# Patient Record
Sex: Female | Born: 1980 | Race: Black or African American | Hispanic: No | Marital: Single | Smoking: Never smoker
Health system: Southern US, Community
[De-identification: ages and names within clinical notes are randomized; demographics above are authoritative.]

## PROBLEM LIST (undated history)

## (undated) DIAGNOSIS — R030 Elevated blood-pressure reading, without diagnosis of hypertension: Secondary | ICD-10-CM

## (undated) DIAGNOSIS — I10 Essential (primary) hypertension: Secondary | ICD-10-CM

## (undated) DIAGNOSIS — B379 Candidiasis, unspecified: Secondary | ICD-10-CM

## (undated) HISTORY — DX: Elevated blood-pressure reading, without diagnosis of hypertension: R03.0

## (undated) HISTORY — PX: WISDOM TOOTH EXTRACTION: SHX21

## (undated) HISTORY — DX: Candidiasis, unspecified: B37.9

## (undated) HISTORY — PX: HIP SURGERY: SHX245

## (undated) HISTORY — PX: TUBAL LIGATION: SHX77

## (undated) HISTORY — PX: OTHER SURGICAL HISTORY: SHX169

## (undated) HISTORY — DX: Essential (primary) hypertension: I10

---

## 2001-12-16 ENCOUNTER — Other Ambulatory Visit: Admission: RE | Admit: 2001-12-16 | Discharge: 2001-12-16 | Payer: Self-pay | Admitting: *Deleted

## 2002-06-13 ENCOUNTER — Emergency Department (HOSPITAL_COMMUNITY): Admission: EM | Admit: 2002-06-13 | Discharge: 2002-06-13 | Payer: Self-pay | Admitting: Emergency Medicine

## 2004-05-06 ENCOUNTER — Other Ambulatory Visit: Admission: RE | Admit: 2004-05-06 | Discharge: 2004-05-06 | Payer: Self-pay | Admitting: Family Medicine

## 2005-07-22 ENCOUNTER — Other Ambulatory Visit: Admission: RE | Admit: 2005-07-22 | Discharge: 2005-07-22 | Payer: Self-pay | Admitting: Obstetrics and Gynecology

## 2005-12-14 ENCOUNTER — Inpatient Hospital Stay (HOSPITAL_COMMUNITY): Admission: AD | Admit: 2005-12-14 | Discharge: 2005-12-14 | Payer: Self-pay | Admitting: Obstetrics and Gynecology

## 2005-12-25 ENCOUNTER — Inpatient Hospital Stay (HOSPITAL_COMMUNITY): Admission: AD | Admit: 2005-12-25 | Discharge: 2005-12-25 | Payer: Self-pay | Admitting: Obstetrics and Gynecology

## 2005-12-26 ENCOUNTER — Inpatient Hospital Stay (HOSPITAL_COMMUNITY): Admission: AD | Admit: 2005-12-26 | Discharge: 2005-12-29 | Payer: Self-pay | Admitting: Obstetrics and Gynecology

## 2005-12-27 ENCOUNTER — Encounter (INDEPENDENT_AMBULATORY_CARE_PROVIDER_SITE_OTHER): Payer: Self-pay | Admitting: Specialist

## 2006-04-24 ENCOUNTER — Emergency Department (HOSPITAL_COMMUNITY): Admission: EM | Admit: 2006-04-24 | Discharge: 2006-04-25 | Payer: Self-pay | Admitting: Emergency Medicine

## 2006-06-23 ENCOUNTER — Encounter: Admission: RE | Admit: 2006-06-23 | Discharge: 2006-06-23 | Payer: Self-pay | Admitting: Surgery

## 2006-06-24 ENCOUNTER — Ambulatory Visit (HOSPITAL_BASED_OUTPATIENT_CLINIC_OR_DEPARTMENT_OTHER): Admission: RE | Admit: 2006-06-24 | Discharge: 2006-06-24 | Payer: Self-pay | Admitting: Surgery

## 2006-06-24 ENCOUNTER — Encounter (INDEPENDENT_AMBULATORY_CARE_PROVIDER_SITE_OTHER): Payer: Self-pay | Admitting: Surgery

## 2006-07-03 ENCOUNTER — Emergency Department (HOSPITAL_COMMUNITY): Admission: EM | Admit: 2006-07-03 | Discharge: 2006-07-03 | Payer: Self-pay | Admitting: Emergency Medicine

## 2008-09-05 ENCOUNTER — Emergency Department (HOSPITAL_BASED_OUTPATIENT_CLINIC_OR_DEPARTMENT_OTHER): Admission: EM | Admit: 2008-09-05 | Discharge: 2008-09-05 | Payer: Self-pay | Admitting: Emergency Medicine

## 2008-09-13 ENCOUNTER — Encounter: Admission: RE | Admit: 2008-09-13 | Discharge: 2008-10-01 | Payer: Self-pay | Admitting: Sports Medicine

## 2009-06-24 ENCOUNTER — Ambulatory Visit (HOSPITAL_COMMUNITY): Admission: RE | Admit: 2009-06-24 | Discharge: 2009-06-24 | Payer: Self-pay | Admitting: Family Medicine

## 2010-05-20 NOTE — Op Note (Signed)
[name], [name] NO.:  [id number]   MEDICAL RECORD NO.:  [id number]          PATIENT TYPE:  AMB   LOCATION:  DSC                          FACILITY:  [facility]   PHYSICIAN:  Thomas A. Cornett, M.D.DATE OF BIRTH:  [birth year]   DATE OF PROCEDURE:  06/24/2006  DATE OF DISCHARGE:                               OPERATIVE REPORT   PREOPERATIVE DIAGNOSIS:  Bilateral axillary hidradenitis suppurativa  measuring approximately 5 x 8 cm in maximal diameter.   POSTOPERATIVE DIAGNOSIS:  Bilateral axillary hidradenitis suppurativa  measuring approximately 5 x 8 cm in maximal diameter.   PROCEDURE:  Excision of bilateral axillary hidradenitis measuring 5 x 8  cm with intermediate two-layer closure of axillae.   SURGEON:  Maisie Fus A. Cornett, M.D.   ANESTHESIA:  LMA with 20 mL of 0.25% Sensorcaine.   SPECIMEN:  Axillary skin with hidradenitis to pathology.   INDICATIONS FOR PROCEDURE:  The patient is a 30 year old female with  recurrent episodes of hidradenitis suppurativa in both axillae.  She  presents today for wide excision of the diseased tissue.   DESCRIPTION OF PROCEDURE:  The patient was brought to the operating room  and placed supine.  After induction of general anesthesia, both axillae  were prepped and draped in a sterile fashion.  Both breasts were taped  together to create traction.  The right axilla was addressed first.  The  area measured 5 x 9 cm.  I infiltrated this with local anesthesia and  excised it with a scalpel down to healthy fat.  A second area just  medial to that was excised measuring 2 x 2 cm.  I then closed both  wounds after creating skin flaps using the cautery with two layers, with  a deep layer of 3-0 Vicryl and 4-0 Monocryl in a subcuticular fashion.  The left axilla was then approached in a similar fashion.  The area  measured 5 x 9 cm.  It was infiltrated as well with local anesthesia.  We excised the area.  A second small area was on  her left arm, we  excised it as well, measuring 1 cm in maximal diameter.  I then again  raised skin flaps and then closed the wound in layers using a deep layer  of 3-0 Vicryl and a 4-0 Monocryl layer to close the skin.  Dermabond was  applied to both incisions.  All final counts of sponge, needle and  instruments were found to be correct for this portion of the case.  The  patient was awoken and taken to recovery in satisfactory condition.      Thomas A. Cornett, M.D.  Electronically Signed     TAC/MEDQ  D:  06/24/2006  T:  06/24/2006  Job:  [id number]

## 2010-05-23 NOTE — Discharge Summary (Signed)
Janet Williams, Janet Williams NO.:  [id number]   MEDICAL RECORD NO.:  [id number]          PATIENT TYPE:  INP   LOCATION:  9124                          [facility]ACILITY:  [facility]   PHYSICIAN:  Charles A. Delcambre, MDDATE OF BIRTH:  1980-10-31   DATE OF ADMISSION:  12/26/2005  DATE OF DISCHARGE:  12/29/2005                               DISCHARGE SUMMARY   PRIMARY DISCHARGE DIAGNOSES:  1. Intrauterine pregnancy, 40 weeks and 1 day.  2. Failure to progress with cephalopelvic disproportion, arrest of      dilatation at 5 centimeters.   PROCEDURE:  Primary low-transverse cesarean section.   DISPOSITION:  The patient discharged home to follow up in the office in  48 hours to discontinue staples. She was given convalescent instructions  to notify if temperature greater than 100 degrees, increase in pain or  bleeding, incisional drainage or erythema. No driving for 2 weeks, no  bath for 2 weeks, to shower okay for this time period. Precaution for  PIH, scotomata, right upper quadrant pain, blurred vision, or headache.   PRESCRIPTIONS GIVEN:  1. Percocet, 5/325, one to two p.o. q.4 h. p.r.n. #40.  2. Hemacyte 1 p.o. every day #30.  3. Motrin 600 mg 1 p.o. q.6 h. p.r.n. #30 one refill.   LABORATORY:  Postoperative hematocrit 30.7, hemoglobin 10.7, PIH panel  was within normal limits day of discharge.  Placenta to L&D.   OPERATIVE FINDINGS:  Vigorous female, Apgars 8 and 8, 9 pounds 7 ounces,  54 cm in length.   DESCRIPTION OF PROCEDURE:  The patient was admitted and, secondary to  decreased fetal movement and moderately favorable cervix, she was  induced first with Cervidil and then with Pitocin. She progressed on to  5 cm but remained there with failure to progress beyond that point. She  was taken for cesarean section with findings as noted above.  Postoperatively, she remained afebrile and did well on postop day #1.  She continued to do well and general diet was given to her  secondary to  positive return of flatus on postop day #1. She tolerated a general  diet. She was noted on postop day #1, evening, to have a blood pressure  of up to 142/101, asymptomatic. During that day, she had had several  pressures in 140s/90s. The morning of postop day 2, was 149/92, repeats  times 3 were 130s/80s. As she remained asymptomatic and with a normal  PIH panel, she was discharged home on early discharge with her adamant  request and with pediatrician discharging the baby. She was strictly  precautioned that she should notify of  any scotomata, right upper quadrant pain, or headache, and we will see  her back in the office in approximately 48 hours to discontinue staples.  The pain was controlled through day 2 and general diet was tolerated.  She voided without difficulty and was ambulating and, for this reason,  was discharged home with followup as noted above.      Charles A. Sydnee Cabal, MD  Electronically Signed     CAD/MEDQ  D:  12/29/2005  T:  12/29/2005  Job:  [id number]

## 2010-05-23 NOTE — Op Note (Signed)
NAMELILYTH, LAWYER NO.:  [id number]   MEDICAL RECORD NO.:  [id number]          PATIENT TYPE:  INP   LOCATION:  9124                          [facility]ACILITY[facility]  [facility]   PHYSICIAN:  Charles A. Delcambre, MDDATE OF BIRTH:  12-29-1980   DATE OF PROCEDURE:  12/27/2005  DATE OF DISCHARGE:                               OPERATIVE REPORT   PREOPERATIVE DIAGNOSES:  1. Intrauterine pregnancy at 40 weeks and one day.  2. Cephalopelvic disproportion.  3. Arrest of dilation at 5 cm.   POSTOPERATIVE DIAGNOSES:  1. Intrauterine pregnancy at 40 weeks and one day.  2. Cephalopelvic disproportion.  3. Arrest of dilation at 5 cm.   PROCEDURE:  Primary low transverse cesarean section.   SURGEON:  Charles A. Delcambre, MD   ASSISTANT:  None.   COMPLICATIONS:  None.   ESTIMATED BLOOD LOSS:  800 cc.   SPECIMENS:  Placenta to delivery.   OPERATIVE FINDINGS:  Vigorous female, Apgars 8 and 8.  9 pounds 7 ounces.   COUNTS:  Instruments, sponge and needle count correct x2.   ANESTHESIA:  Epidural and local.   DESCRIPTION OF PROCEDURE:  The patient was taken to the operating room,  placed in the supine position and anesthesia was dosed.  She was then  sterilely prepped and draped.  The anesthesia was not adequate, and this  requiring a local injection of 10 cc of 0.25% lidocaine with epinephrine  subcutaneously in the area of the incision site.  She did tolerate with  further epidural dosing and anesthesia testing, and for this reason the  procedure was begun.   She tolerated the procedure and the procedure was continued.  A  Pfannenstiel incision was made with the knife and carried down to the  fascia; the fascia was then incised with a knife and Mayo scissors.  The  rectus sheath was released superiorly and inferiorly.  Sharply the  rectus muscles were bluntly dissected in the midline.  The peritoneum  was entered with Metzenbaum scissors without damage to the bladder,  vascular  structures or bowel.  Traction was used to extend this  incision.  A bladder blade was placed.  The vesicouterine peritoneum was  incised with the Metzenbaum scissors. Blunt dissection was used to  develop the bladder flap.  The bladder blade was replaced.  A lower  uterine segment transverse incision was made to amniotomy, without  damage to the infant.  Retractor was used to extend the incision.  Clear  amniotic fluid was noted.  A hand was inserted; CAD was impacted.  The  pelvis was lifted down carefully and vacuum extractor was used to assist  in delivery of the head; with a single pull and pop offs (I placed on  the occiput).  Frontal pressure was applied by the operator assistant.  The cord was clamped.  The infant was shown to the parents, then handed  off to neonatologist Dr. Eric Form , who was present for delivery.  Cord  blood was taken and the uterus was massaged, delivering the placenta by  manual expression.  The placenta was handed off to  go to labor and  delivery.  The uterus was externalized for repair.  The internal surface  was wiped with a moistened lap.  The first layer of running, locking #1  chromic was placed.  A second layer of #1 chromic running nonlocking  imbricating over the first was placed.  Hemostasis was adequate with a  single figure-of-eight suture of 2-0 Vicryl.  Hemostasis was excellent.  The bladder flap anatomy lended itself to closure, as best repaired.  For this reason, 2-0 Vicryl was used to close the bladder flap and  uterovesical serosa without difficulty.  Hemostasis was adequate.  Uterus was re-internalized.  Pericolic gutters  were cleansed of clot and blood and material.  Hemostasis was verified  at the uterine incision.  The ovaries and tubes had been seen, the  uterus was otherwise normal.  The subfascial hemostasis was excellent.  The fascia was closed with #1 Vicryl running, locking sutures.  Subcutaneous irrigation was carried out.   Hemostasis was excellent.  Sterile skin  clips were used to close the skin.  Sterile dressing was  applied.  The patient tolerated the procedure well, and was taken to  recovery with the physician in attendance.      Charles A. Sydnee Cabal, MD  Electronically Signed     CAD/MEDQ  D:  12/27/2005  T:  12/27/2005  Job:  [id number]

## 2010-09-26 ENCOUNTER — Ambulatory Visit: Payer: Self-pay | Admitting: *Deleted

## 2010-10-22 LAB — CBC
HCT: 37.3
Hemoglobin: 12.8
RBC: 4.59
RDW: 14.7 — ABNORMAL HIGH
WBC: 4.5

## 2010-10-22 LAB — DIFFERENTIAL
Lymphs Abs: 1.4
Monocytes Absolute: 0.3
Monocytes Relative: 8
Neutro Abs: 2.5
Neutrophils Relative %: 55

## 2010-10-22 LAB — BASIC METABOLIC PANEL
BUN: 5 — ABNORMAL LOW
CO2: 27
Chloride: 103
GFR calc non Af Amer: >60
Glucose, Bld: 95
Potassium: 4.3
Sodium: 137

## 2012-01-27 ENCOUNTER — Encounter: Payer: Self-pay | Admitting: Obstetrics and Gynecology

## 2012-01-27 ENCOUNTER — Ambulatory Visit: Payer: Self-pay | Admitting: Obstetrics and Gynecology

## 2012-01-27 VITALS — BP 118/80 | Ht 67.0 in | Wt 246.0 lb

## 2012-01-27 DIAGNOSIS — Z9851 Tubal ligation status: Secondary | ICD-10-CM

## 2012-01-27 NOTE — Progress Notes (Signed)
Subjective:    Janet Williams is a 32 y.o. female, G2P3, who presents for Robotic Tubal Reversal Consult.   The following portions of the patient's history were reviewed and updated as appropriate: allergies, current medications, past family history.  Review of Systems Pertinent items are noted in HPI.   Objective:    Ht 5\' 7"  (1.702 m)  Wt 246 lb (111.585 kg)  BMI 38.53 kg/m2  LMP 01/09/2012    Weight:  Wt Readings from Last 1 Encounters:  01/27/12 246 lb (111.585 kg)          BMI: Body mass index is 38.53 kg/(m^2).  General Appearance: Alert, appropriate appearance for age. No acute distress GYN exam: deferred   Assessment:    S/P Pomeroy BTL 2009 in patient who desires future pregnancy    Plan:    30 minutes face to face reviewing:  1. Robotic tubal reversal with length of procedure and expected recovery 2. Need to have enough tubal length in both proximal and distal segments. Op report reviewed: Pomeroy and cauterisation. 3. 70-90 % success rate when sufficient length of final reconstructed tube 4. Possibility of post-op tubal blockage: recommend HSG at 8-12 weeks post-op 5. 25 % risk of tubal pregnancy after tubal reversal with need for early serial quantitative hCG and early ultrasound. Medical and surgical           treatment of tubal pregnancy briefly reviewed. 6. Option of IVF and offer for referral to [facility] discussed: declined at this time 7. Suggest double set-up with diagnostic laparoscopy prior to proceeding with tubal reversal. If length of stumps judged insufficient, would end  procedure and plan reimbursement of fees except for the diagnostic portion.  8. Expected cost of procedure reviewed  Questions answered. Patient to call with final decision.   Silverio Lay MD

## 2012-08-14 ENCOUNTER — Ambulatory Visit (INDEPENDENT_AMBULATORY_CARE_PROVIDER_SITE_OTHER): Payer: BC Managed Care – PPO | Admitting: Family Medicine

## 2012-08-14 VITALS — BP 126/84 | HR 82 | Temp 98.1°F | Resp 18 | Ht 66.0 in | Wt 242.0 lb

## 2012-08-14 DIAGNOSIS — IMO0002 Reserved for concepts with insufficient information to code with codable children: Secondary | ICD-10-CM

## 2012-08-14 DIAGNOSIS — L03114 Cellulitis of left upper limb: Secondary | ICD-10-CM

## 2012-08-14 MED ORDER — DOXYCYCLINE HYCLATE 100 MG PO TABS: 100.0000 mg | ORAL_TABLET | Freq: Two times a day (BID) | ORAL | Status: DC

## 2012-08-14 NOTE — Patient Instructions (Signed)
Warm compresses over area 3-4 times per day. Soap and water cleansing 2-3 times per day. Benadryl if needed for itching, or topical cortisone cream over the counter.  Recheck next 2-3 days of area not improving.  Return to the clinic or go to the nearest emergency room if any of your symptoms worsen or new symptoms occur. Cellulitis Cellulitis is an infection of the skin and the tissue beneath it. The infected area is usually red and tender. Cellulitis occurs most often in the arms and lower legs.  CAUSES  Cellulitis is caused by bacteria that enter the skin through cracks or cuts in the skin. The most common types of bacteria that cause cellulitis are Staphylococcus and Streptococcus. SYMPTOMS   Redness and warmth.  Swelling.  Tenderness or pain.  Fever. DIAGNOSIS  Your caregiver can usually determine what is wrong based on a physical exam. Blood tests may also be done. TREATMENT  Treatment usually involves taking an antibiotic medicine. HOME CARE INSTRUCTIONS   Take your antibiotics as directed. Finish them even if you start to feel better.  Keep the infected arm or leg elevated to reduce swelling.  Apply a warm cloth to the affected area up to 4 times per day to relieve pain.  Only take over-the-counter or prescription medicines for pain, discomfort, or fever as directed by your caregiver.  Keep all follow-up appointments as directed by your caregiver. SEEK MEDICAL CARE IF:   You notice red streaks coming from the infected area.  Your red area gets larger or turns dark in color.  Your bone or joint underneath the infected area becomes painful after the skin has healed.  Your infection returns in the same area or another area.  You notice a swollen bump in the infected area.  You develop new symptoms. SEEK IMMEDIATE MEDICAL CARE IF:   You have a fever.  You feel very sleepy.  You develop vomiting or diarrhea.  You have a general ill feeling (malaise) with muscle  aches and pains. MAKE SURE YOU:   Understand these instructions.  Will watch your condition.  Will get help right away if you are not doing well or get worse. Document Released: 10/01/2004 Document Revised: 06/23/2011 Document Reviewed: 03/09/2011 [facility] Patient Information 2014 [place], [place].

## 2012-08-14 NOTE — Progress Notes (Signed)
Subjective:    Patient ID: Janet Williams, female    DOB: [birth year], 32 y.o.   MRN: [id number]  HPI Janet Williams is a 32 y.o. female  Noticed reddened area on L wrist - 2 days ago - did not remember being bit by anything, but initially bump - itched.  More swollen and leaking fluid over past 24 hours. No fever. No known spider bite.   Stomach upset this morning, but kids have had stomach bug (n/v/d) and ate cajun food last night. Stomach feel sfine right now. Ate pizza today at birthday party.  Tx: alcohol topically  Review of Systems  Constitutional: Negative for fever and chills.  Gastrointestinal: Negative for nausea, vomiting, abdominal pain and diarrhea.  Skin: Positive for rash.       Objective:   Physical Exam  Vitals reviewed. Constitutional: She is oriented to person, place, and time. She appears well-developed and well-nourished. No distress.  Pulmonary/Chest: Effort normal.  Neurological: She is alert and oriented to person, place, and time.  Skin: Skin is warm and dry.     Psychiatric: She has a normal mood and affect. Her behavior is normal.      Assessment & Plan:  Janet Williams is a 32 y.o. female Cellulitis of arm, left - Plan: Wound culture, doxycycline (VIBRA-TABS) 100 MG tablet spider vs insect bite with central vesicular area, but with surrounding erythema - concern for MRSA.  Start doxycycline, warm compresses.  rtc precautions.   Patient Instructions  Warm compresses over area 3-4 times per day. Soap and water cleansing 2-3 times per day. Benadryl if needed for itching, or topical cortisone cream over the counter.  Recheck next 2-3 days of area not improving.  Return to the clinic or go to the nearest emergency room if any of your symptoms worsen or new symptoms occur. Cellulitis Cellulitis is an infection of the skin and the tissue beneath it. The infected area is usually red and tender. Cellulitis occurs most often in the arms and lower  legs.  CAUSES  Cellulitis is caused by bacteria that enter the skin through cracks or cuts in the skin. The most common types of bacteria that cause cellulitis are Staphylococcus and Streptococcus. SYMPTOMS   Redness and warmth.  Swelling.  Tenderness or pain.  Fever. DIAGNOSIS  Your caregiver can usually determine what is wrong based on a physical exam. Blood tests may also be done. TREATMENT  Treatment usually involves taking an antibiotic medicine. HOME CARE INSTRUCTIONS   Take your antibiotics as directed. Finish them even if you start to feel better.  Keep the infected arm or leg elevated to reduce swelling.  Apply a warm cloth to the affected area up to 4 times per day to relieve pain.  Only take over-the-counter or prescription medicines for pain, discomfort, or fever as directed by your caregiver.  Keep all follow-up appointments as directed by your caregiver. SEEK MEDICAL CARE IF:   You notice red streaks coming from the infected area.  Your red area gets larger or turns dark in color.  Your bone or joint underneath the infected area becomes painful after the skin has healed.  Your infection returns in the same area or another area.  You notice a swollen bump in the infected area.  You develop new symptoms. SEEK IMMEDIATE MEDICAL CARE IF:   You have a fever.  You feel very sleepy.  You develop vomiting or diarrhea.  You have a general ill feeling (malaise) with  muscle aches and pains. MAKE SURE YOU:   Understand these instructions.  Will watch your condition.  Will get help right away if you are not doing well or get worse. Document Released: 10/01/2004 Document Revised: 06/23/2011 Document Reviewed: 03/09/2011 [facility] Patient Information 2014 [place], [place].

## 2012-08-18 LAB — WOUND CULTURE
Gram Stain: NONE SEEN
Gram Stain: NONE SEEN

## 2012-08-20 MED ORDER — CIPROFLOXACIN HCL 500 MG PO TABS: 500.0000 mg | ORAL_TABLET | Freq: Two times a day (BID) | ORAL | Status: DC

## 2012-08-20 NOTE — Addendum Note (Signed)
Addended by: Meredith Staggers R on: 08/20/2012 08:16 AM   Modules accepted: Orders, Medications

## 2012-08-20 NOTE — Progress Notes (Signed)
Culture of wound noted.  See lab result note- will change doxycycline to cipro 500mg  BID - 7 days. rtc precautions if not improving as ddx of spider bite still possible.

## 2012-08-31 ENCOUNTER — Ambulatory Visit: Payer: Self-pay | Admitting: Obstetrics & Gynecology

## 2012-09-08 ENCOUNTER — Ambulatory Visit (INDEPENDENT_AMBULATORY_CARE_PROVIDER_SITE_OTHER): Payer: BC Managed Care – PPO | Admitting: Obstetrics & Gynecology

## 2012-09-08 ENCOUNTER — Encounter: Payer: Self-pay | Admitting: Obstetrics & Gynecology

## 2012-09-08 VITALS — BP 119/79 | HR 74 | Temp 99.2°F | Ht 67.0 in | Wt 241.0 lb

## 2012-09-08 DIAGNOSIS — Z01419 Encounter for gynecological examination (general) (routine) without abnormal findings: Secondary | ICD-10-CM

## 2012-09-08 MED ORDER — NORETHIN ACE-ETH ESTRAD-FE 1-20 MG-MCG PO TABS: 1.0000 | ORAL_TABLET | Freq: Every day | ORAL | Status: DC

## 2012-09-08 NOTE — Progress Notes (Signed)
Subjective:     Janet Williams is a 32 y.o. female here for a routine exam.  Current complaints: Patient is in the office for annual exam. Patient wants to discuss reversal of tubal..  Personal health questionnaire reviewed: no.   Gynecologic History No LMP recorded. Patient is not currently having periods (Reason: IUD). Contraception: tubal ligation Last Pap: 1 year. Results were: normal   Obstetric History OB History  Gravida Para Term Preterm AB SAB TAB Ectopic Multiple Living  2 2 2      1 3     # Outcome Date GA Lbr Len/2nd Weight Sex Delivery Anes PTL Lv  2A TRM 08/01/07 [redacted]w[redacted]d   F [facility]   Y  2B  08/01/07 [redacted]w[redacted]d   F [facility]   Y  1 TRM 12/27/05 [redacted]w[redacted]d   M [facility]   Y       The following portions of the patient's history were reviewed and updated as appropriate: allergies, current medications, past family history, past medical history, past social history, past surgical history and problem list.  Review of Systems Pertinent items are noted in HPI.    Objective:      General appearance: alert Breasts: normal appearance, no masses or tenderness Abdomen: soft, non-tender; bowel sounds normal; no masses,  no organomegaly Pelvic: cervix normal in appearance, external genitalia normal, no adnexal masses or tenderness, uterus normal size, shape, and consistency and vagina normal without discharge    Assessment:    Healthy female exam.    Plan:     Offered referral-->[facility] Return prn Orders Placed This Encounter  Procedures  . CBC  . HIV antibody  . RPR  . Hemoglobin A1c

## 2012-09-09 LAB — CBC
HCT: 38.5 % (ref 36.0–46.0)
Hemoglobin: 12.9 g/dL (ref 12.0–15.0)
MCH: 28.9 pg (ref 26.0–34.0)
MCHC: 33.5 g/dL (ref 30.0–36.0)
MCV: 86.1 fL (ref 78.0–100.0)
RBC: 4.47 MIL/uL (ref 3.87–5.11)

## 2012-09-09 LAB — RPR

## 2012-09-09 LAB — HEMOGLOBIN A1C
Hgb A1c MFr Bld: 5.7 % — ABNORMAL HIGH (ref <5.7)
Mean Plasma Glucose: 117 mg/dL — ABNORMAL HIGH (ref <117)

## 2012-09-12 LAB — PAP IG, CT-NG, RFX HPV ASCU

## 2012-09-16 ENCOUNTER — Encounter: Payer: Self-pay | Admitting: Obstetrics & Gynecology

## 2012-09-16 DIAGNOSIS — IMO0002 Reserved for concepts with insufficient information to code with codable children: Secondary | ICD-10-CM | POA: Insufficient documentation

## 2012-09-19 ENCOUNTER — Encounter: Payer: Self-pay | Admitting: Obstetrics & Gynecology

## 2012-10-18 ENCOUNTER — Encounter: Payer: Self-pay | Admitting: *Deleted

## 2012-11-03 ENCOUNTER — Ambulatory Visit (INDEPENDENT_AMBULATORY_CARE_PROVIDER_SITE_OTHER): Payer: BC Managed Care – PPO | Admitting: Obstetrics & Gynecology

## 2012-11-03 ENCOUNTER — Encounter: Payer: Self-pay | Admitting: Obstetrics & Gynecology

## 2012-11-03 VITALS — BP 103/66 | HR 73 | Temp 98.7°F | Ht 67.0 in | Wt 245.0 lb

## 2012-11-03 DIAGNOSIS — Z30430 Encounter for insertion of intrauterine contraceptive device: Secondary | ICD-10-CM

## 2012-11-03 DIAGNOSIS — Z3202 Encounter for pregnancy test, result negative: Secondary | ICD-10-CM

## 2012-11-03 MED ORDER — NORETHINDRONE ACETATE 5 MG PO TABS: 5.0000 mg | ORAL_TABLET | Freq: Every day | ORAL | Status: DC

## 2012-11-03 NOTE — Patient Instructions (Signed)
Colposcopy Colposcopy is a procedure that uses a special lighted microscope (colposcope). It examines your cervix and vagina, or the area around the outside of the vagina, for signs of disease or abnormalities in the cells. You may be sent to a specialist (gynecologist) to do the colposcopy. A biopsy (tissue sample) may be collected during a colposcopy, if the caregiver finds any unusual cells. The biopsy is sent to the lab for further testing, and the results are reported back to your caregiver. A WOMAN MAY NEED THIS PROCEDURE IF:  She has had an abnormal pap smear (taking cells from the cervix for testing).  She has a sore on her cervix, and a Pap test was normal.  The Pap test suggests human papilloma virus (HPV). This virus can cause genital warts and is linked to the development of cervical cancer.  She has genital warts on the cervix, or in or around the outside of the vagina.  Her mother took the drug DES while pregnant.  She has painful intercourse.  She has vaginal bleeding, especially after sexual intercourse.  There is a need to evaluate the results of previous treatment. BEFORE THE PROCEDURE   Colposcopy is done when you are not having a menstrual period.  For 24 hours before the colposcopy, do not:  Douche.  Use tampons.  Use medicines, creams, or suppositories in the vagina.  Have sexual intercourse. PROCEDURE   A colposcopy is done while a woman is lying on her back with her feet in foot rests (stirrups).  A speculum is placed inside the vagina to keep it open and to allow the caregiver to see the cervix. This is the same instrument used to do a pap smear.  The colposcope is placed outside the vagina. It is used to magnify and examine the cervix, vagina, and the area around the outside of the vagina.  A small amount of liquid solution is placed on the area that is to be viewed. This solution is placed on with a cotton applicator. This solution makes it easier to  see the abnormal cells.  Your caregiver will suck out mucus and cells from the canal of the cervix.  Small pieces of tissue for biopsy may be taken at the same time. You may feel mild pain or discomfort when this is done.  Your caregiver will record the location of the abnormal areas and send the tissue samples to a lab for analysis.  If your caregiver biopsies the vagina or outside of the vagina, a local anesthetic (novocaine) is usually given. AFTER THE PROCEDURE   You may have some cramping that often goes away in a few minutes. You may have some soreness for a couple of days.  You may take over-the-counter pain medicine as advised by your caregiver. Do not take aspirin because it can cause bleeding.  Lie down for a few minutes if you feel lightheaded.  You may have some bleeding or dark discharge that should stop in a few days.  You may need to wear a sanitary pad for a few days. HOME CARE INSTRUCTIONS   Avoid sex, douching, and using tampons for a week or as directed.  Only take medicine as directed by your caregiver.  Continue to take birth control pills, if you are on them.  Not all test results are available during your visit. If your test results are not back during the visit, make an appointment with your caregiver to find out the results. Do not assume everything is   normal if you have not heard from your caregiver or the medical facility. It is important for you to follow up on all of your test results.  Follow your caregiver's advice regarding medicines, activity, follow-up visits, and follow-up Pap tests. SEEK MEDICAL CARE IF:   You develop a rash.  You have problems with your medicine. SEEK IMMEDIATE MEDICAL CARE IF:  You are bleeding heavily or are passing blood clots.  You develop a fever over 102 F (38.9 C), with or without chills.  You have abnormal vaginal discharge.  You are having cramps that do not go away after taking your pain medicine.  You  feel lightheaded, dizzy, or faint.  You develop stomach pain. Document Released: 03/14/2002 Document Revised: 03/16/2011 Document Reviewed: 10/25/2008 [employer] Patient Information 2014 [employer].  

## 2012-11-03 NOTE — Progress Notes (Signed)
.  IUD Insertion Procedure Note  Pre-operative Diagnosis: AUB  Post-operative Diagnosis: same  Indications: AUB    Procedure Details  Urine pregnancy test was negative.  The risks (including infection, bleeding, pain, and uterine perforation) and benefits of the procedure were explained to the patient and Written informed consent was obtained.    Cervix cleansed with Betadine. Uterus sounded to 8 cm. IUD inserted without difficulty. String visible and trimmed. Patient tolerated procedure well.  An U/S showed good positioning.  IUD Information: Mirena.  Condition: Stable  Complications: None  Plan:  The patient was advised to call for any fever or for prolonged or severe pain or bleeding. She was advised to use OTC analgesics as needed for mild to moderate pain.

## 2012-11-04 ENCOUNTER — Encounter: Payer: Self-pay | Admitting: Obstetrics & Gynecology

## 2012-11-08 ENCOUNTER — Telehealth: Payer: Self-pay | Admitting: *Deleted

## 2012-11-08 NOTE — Telephone Encounter (Signed)
error 

## 2012-12-12 ENCOUNTER — Encounter: Payer: Self-pay | Admitting: Obstetrics & Gynecology

## 2012-12-15 ENCOUNTER — Ambulatory Visit: Payer: BC Managed Care – PPO | Admitting: Obstetrics & Gynecology

## 2012-12-15 ENCOUNTER — Other Ambulatory Visit: Payer: Self-pay

## 2012-12-15 ENCOUNTER — Ambulatory Visit (INDEPENDENT_AMBULATORY_CARE_PROVIDER_SITE_OTHER): Payer: BC Managed Care – PPO | Admitting: Obstetrics & Gynecology

## 2012-12-15 VITALS — BP 142/94 | HR 67 | Temp 98.5°F | Wt 238.0 lb

## 2012-12-15 DIAGNOSIS — N760 Acute vaginitis: Secondary | ICD-10-CM

## 2012-12-15 DIAGNOSIS — R87810 Cervical high risk human papillomavirus (HPV) DNA test positive: Secondary | ICD-10-CM

## 2012-12-15 NOTE — Progress Notes (Signed)
  Colposcopy Procedure Note  Indications: Pap smear 4 months ago showed: atypical squamous cellularity of undetermined significance (ASCUS). The prior pap showed no abnormalities.   Prior cervical treatment: one previous colposcopy.  Procedure Details  The risks and benefits of the procedure and Written informed consent obtained.  Speculum placed in vagina and excellent visualization of cervix achieved, cervix swabbed x 3 with acetic acid solution.  Findings: Cervix: no visible lesions; SCJ visualized 360 degrees without lesions and endocervical curettage performed.   Specimens: ECC  Complications: none.  Plan: Specimens labelled and sent to Pathology. Will base further treatment on Pathology findings.

## 2012-12-16 LAB — WET PREP BY MOLECULAR PROBE: Candida species: NEGATIVE

## 2012-12-16 NOTE — Patient Instructions (Signed)
Colposcopy Care After Colposcopy is a procedure in which a special tool is used to magnify the surface of the cervix. A tissue sample (biopsy) may also be taken. This sample will be looked at for cervical cancer or other problems. After the test:  You may have some cramping.  Lie down for a few minutes if you feel lightheaded.   You may have some bleeding which should stop in a few days. HOME CARE  Do not have sex or use tampons for 2 to 3 days or as told.  Only take medicine as told by your doctor.  Continue to take your birth control pills as usual. Finding out the results of your test Ask when your test results will be ready. Make sure you get your test results. GET HELP RIGHT AWAY IF:  You are bleeding a lot or are passing blood clots.  You develop a fever of 102 F (38.9 C) or higher.  You have abnormal vaginal discharge.  You have cramps that do not go away with medicine.  You feel lightheaded, dizzy, or pass out (faint). MAKE SURE YOU:   Understand these instructions.  Will watch your condition.  Will get help right away if you are not doing well or get worse. Document Released: 06/10/2007 Document Revised: 03/16/2011 Document Reviewed: 07/21/2012 [employer] Patient Information 2014 [employer].  

## 2012-12-28 ENCOUNTER — Ambulatory Visit (INDEPENDENT_AMBULATORY_CARE_PROVIDER_SITE_OTHER): Payer: BC Managed Care – PPO | Admitting: Internal Medicine

## 2012-12-28 VITALS — BP 122/78 | HR 85 | Temp 98.5°F | Resp 17 | Ht 66.5 in | Wt 234.0 lb

## 2012-12-28 DIAGNOSIS — R05 Cough: Secondary | ICD-10-CM

## 2012-12-28 DIAGNOSIS — J019 Acute sinusitis, unspecified: Secondary | ICD-10-CM

## 2012-12-28 MED ORDER — AMOXICILLIN 875 MG PO TABS: 875.0000 mg | ORAL_TABLET | Freq: Two times a day (BID) | ORAL | Status: DC

## 2012-12-28 MED ORDER — HYDROCODONE-HOMATROPINE 5-1.5 MG/5ML PO SYRP: 5.0000 mL | ORAL_SOLUTION | Freq: Four times a day (QID) | ORAL | Status: DC | PRN

## 2012-12-28 NOTE — Progress Notes (Signed)
   Subjective:    Patient ID: Janet Williams, female    DOB: 01-03-1981, 32 y.o.   MRN: [id number]  HPI  32 y.o. Female presents to clinic with cough , congestions, nausea and vomiting. Is also having chest pain and abdominal discomfort with current symptoms. Denies any sore throat or urinary symptoms .   Abdominal pain is left lower side. Feels a stabbing like pain with coughing. Is blowing a lot out of nose early in the morning upon waking. Cough is productive with green/yellow mucus/phlegm. Has been using afrin for symptoms . Patient had flu shot this year. Is a teacher Review of Systems Noncontributory    Objective:   Physical Exam BP 122/78  Pulse 85  Temp(Src) 98.5 F (36.9 C) (Oral)  Resp 17  Ht 5' 6.5" (1.689 m)  Wt 234 lb (106.142 kg)  BMI 37.21 kg/m2  SpO2 99% TMs clear Nares with purulent mucus/tender maxillary areas Throat clear No nodes Chest clear       Assessment & Plan:  Acute sinusitis, unspecified  Cough  Meds ordered this encounter  Medications  . amoxicillin (AMOXIL) 875 MG tablet    Sig: Take 1 tablet (875 mg total) by mouth 2 (two) times daily.    Dispense:  20 tablet    Refill:  0  . HYDROcodone-homatropine (HYCODAN) 5-1.5 MG/5ML syrup    Sig: Take 5 mLs by mouth every 6 (six) hours as needed for cough.    Dispense:  120 mL    Refill:  0

## 2013-01-06 ENCOUNTER — Other Ambulatory Visit: Payer: Self-pay | Admitting: *Deleted

## 2013-01-06 DIAGNOSIS — B9689 Other specified bacterial agents as the cause of diseases classified elsewhere: Secondary | ICD-10-CM

## 2013-01-06 DIAGNOSIS — N760 Acute vaginitis: Principal | ICD-10-CM

## 2013-01-06 DIAGNOSIS — B379 Candidiasis, unspecified: Secondary | ICD-10-CM

## 2013-01-06 MED ORDER — METRONIDAZOLE 500 MG PO TABS: 500.0000 mg | ORAL_TABLET | Freq: Two times a day (BID) | ORAL | Status: DC

## 2013-01-06 MED ORDER — FLUCONAZOLE 150 MG PO TABS: 150.0000 mg | ORAL_TABLET | Freq: Once | ORAL | Status: DC

## 2013-09-21 ENCOUNTER — Ambulatory Visit: Payer: BC Managed Care – PPO | Admitting: Obstetrics & Gynecology

## 2013-10-04 ENCOUNTER — Encounter: Payer: Self-pay | Admitting: Obstetrics & Gynecology

## 2013-10-04 ENCOUNTER — Ambulatory Visit (INDEPENDENT_AMBULATORY_CARE_PROVIDER_SITE_OTHER): Payer: BC Managed Care – PPO | Admitting: Obstetrics & Gynecology

## 2013-10-04 VITALS — BP 148/101 | HR 87 | Temp 98.0°F | Ht 67.0 in | Wt 241.0 lb

## 2013-10-04 DIAGNOSIS — Z01419 Encounter for gynecological examination (general) (routine) without abnormal findings: Secondary | ICD-10-CM

## 2013-10-04 DIAGNOSIS — Z23 Encounter for immunization: Secondary | ICD-10-CM

## 2013-10-04 NOTE — Progress Notes (Signed)
Subjective:     Janet Williams is a 33 y.o. female here for a routine exam.  Current complaints: none.    Personal health questionnaire:  Is patient Ashkenazi Jewish, have a family history of breast and/or ovarian cancer: no Is there a family history of uterine cancer diagnosed at age < 21, gastrointestinal cancer, urinary tract cancer, family member who is a Personnel officer syndrome-associated carrier: no Is the patient overweight and hypertensive, family history of diabetes, personal history of gestational diabetes or PCOS: yes Is patient over 1, have PCOS,  family history of premature CHD under age 65, diabetes, smoke, have hypertension or peripheral artery disease:  no At any time, has a partner hit, kicked or otherwise hurt or frightened you?: no Over the past 2 weeks, have you felt down, depressed or hopeless?: no Over the past 2 weeks, have you felt little interest or pleasure in doing things?:no   Gynecologic History No LMP recorded. Patient is not currently having periods (Reason: IUD). Contraception: IUD Last Pap: 9/14. 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 Yeast infection   . Increased blood pressure (not hypertension)   . Hypertension     Past Surgical History  Procedure Laterality Date  . Cesarean section    . Tubal ligation    . Wisdom tooth extraction    . Lymphnod removal    . Hip surgery      No current outpatient prescriptions on file. No Known Allergies  History  Substance Use Topics  . Smoking status: Never Smoker   . Smokeless tobacco: Never Used  . Alcohol Use: No    Family History  Problem  Relation Age of Onset  . Hypertension Maternal Grandmother   . Diabetes Maternal Grandmother   . Stroke Maternal Grandmother   . Hypertension Mother   . Hypertension Maternal Aunt       Review of Systems  Constitutional: negative for fatigue and weight loss Respiratory: negative for cough and wheezing Cardiovascular: negative for chest pain, fatigue and palpitations Gastrointestinal: negative for abdominal pain and change in bowel habits Musculoskeletal:negative for myalgias Neurological: negative for gait problems and tremors Behavioral/Psych: negative for abusive relationship, depression Endocrine: negative for temperature intolerance   Genitourinary:negative for abnormal menstrual periods, genital lesions, hot flashes, sexual problems and vaginal discharge Integument/breast: negative for breast lump, breast tenderness, nipple discharge and skin lesion(s)    Objective:       BP 148/101  Pulse 87  Temp(Src) 98 F (36.7 C)  Ht 5\' 7"  (1.702 m)  Wt 109.317 kg (241 lb)  BMI 37.74 kg/m2 General:   alert  Skin:   no rash or abnormalities  Lungs:   clear to auscultation bilaterally  Heart:   regular rate and rhythm, S1, S2 normal, no murmur, click, rub or gallop  Breasts:   normal without suspicious masses, skin or nipple changes or axillary nodes  Abdomen:  normal findings: no organomegaly, soft, non-tender and no hernia  Pelvis:  External genitalia: normal general appearance Urinary system: urethral meatus normal and bladder without fullness, nontender Vaginal: normal without tenderness, induration or masses Cervix: normal appearance; IUD strings present Adnexa: normal bimanual exam Uterus: anteverted and non-tender, normal size   Lab Review  Labs reviewed yes Radiologic studies reviewed yes   Assessment:    Healthy female exam.  Prediabetes  Plan:    Education reviewed: calcium supplements, low fat, low cholesterol diet and weight bearing  exercise. Contraception: IUD.  Check Pap/HPV co-test  Orders Placed This Encounter  Procedures  . GC/Chlamydia Probe Amp  . Flu Vaccine QUAD 36+ mos IM (Fluarix)  . CBC  . HIV antibody  . RPR  . Hemoglobin A1c  . Ambulatory referral to Family Practice    Referral Priority:  Routine    Referral Type:  Consultation    Referral Reason:  Specialty Services Required    Referred to Provider:  Devra Doppamieka Howell, MD    Requested Specialty:  Family Medicine    Number of Visits Requested:  1    Follow up as needed.

## 2013-10-05 ENCOUNTER — Telehealth: Payer: Self-pay

## 2013-10-05 LAB — PAP IG AND HPV HIGH-RISK: HPV DNA High Risk: NOT DETECTED

## 2013-10-05 LAB — RPR

## 2013-10-05 LAB — CBC
HEMATOCRIT: 38.2 % (ref 36.0–46.0)
Hemoglobin: 13.2 g/dL (ref 12.0–15.0)
MCH: 30.1 pg (ref 26.0–34.0)
MCHC: 34.6 g/dL (ref 30.0–36.0)
MCV: 87.2 fL (ref 78.0–100.0)
Platelets: 247 10*3/uL (ref 150–400)
RBC: 4.38 MIL/uL (ref 3.87–5.11)
RDW: 12.9 % (ref 11.5–15.5)
WBC: 5.7 10*3/uL (ref 4.0–10.5)

## 2013-10-05 LAB — HIV ANTIBODY (ROUTINE TESTING W REFLEX): HIV 1&2 Ab, 4th Generation: NONREACTIVE

## 2013-10-05 LAB — HEMOGLOBIN A1C
Hgb A1c MFr Bld: 5.9 % — ABNORMAL HIGH (ref <5.7)
Mean Plasma Glucose: 123 mg/dL — ABNORMAL HIGH (ref <117)

## 2013-10-05 LAB — GC/CHLAMYDIA PROBE AMP
CT Probe RNA: NEGATIVE
GC Probe RNA: NEGATIVE

## 2013-10-05 NOTE — Telephone Encounter (Signed)
TOLD PATIENT APPT DATE AND TIME WITH DR Wandra MannanAMEKA HOWELL 10/17/13 - 10:30AM

## 2013-10-06 NOTE — Patient Instructions (Signed)

## 2013-11-06 ENCOUNTER — Encounter: Payer: Self-pay | Admitting: Obstetrics & Gynecology

## 2014-01-01 ENCOUNTER — Encounter: Payer: Self-pay | Admitting: *Deleted

## 2014-01-02 ENCOUNTER — Encounter: Payer: Self-pay | Admitting: Obstetrics & Gynecology

## 2014-06-27 ENCOUNTER — Encounter (HOSPITAL_COMMUNITY): Payer: Self-pay | Admitting: Emergency Medicine

## 2014-06-27 ENCOUNTER — Emergency Department (HOSPITAL_COMMUNITY): Payer: BC Managed Care – PPO

## 2014-06-27 ENCOUNTER — Emergency Department (HOSPITAL_COMMUNITY)
Admission: EM | Admit: 2014-06-27 | Discharge: 2014-06-27 | Disposition: A | Discharge status: Home / Self Care | Payer: BC Managed Care – PPO | Attending: Emergency Medicine | Admitting: Emergency Medicine

## 2014-06-27 DIAGNOSIS — Y9389 Activity, other specified: Secondary | ICD-10-CM | POA: Diagnosis not present

## 2014-06-27 DIAGNOSIS — I10 Essential (primary) hypertension: Secondary | ICD-10-CM | POA: Insufficient documentation

## 2014-06-27 DIAGNOSIS — Z8619 Personal history of other infectious and parasitic diseases: Secondary | ICD-10-CM | POA: Insufficient documentation

## 2014-06-27 DIAGNOSIS — S93402A Sprain of unspecified ligament of left ankle, initial encounter: Secondary | ICD-10-CM | POA: Diagnosis not present

## 2014-06-27 DIAGNOSIS — S99912A Unspecified injury of left ankle, initial encounter: Secondary | ICD-10-CM | POA: Diagnosis present

## 2014-06-27 DIAGNOSIS — Y9289 Other specified places as the place of occurrence of the external cause: Secondary | ICD-10-CM | POA: Diagnosis not present

## 2014-06-27 DIAGNOSIS — Y998 Other external cause status: Secondary | ICD-10-CM | POA: Diagnosis not present

## 2014-06-27 DIAGNOSIS — W228XXA Striking against or struck by other objects, initial encounter: Secondary | ICD-10-CM | POA: Diagnosis not present

## 2014-06-27 MED ORDER — HYDROCODONE-ACETAMINOPHEN 5-325 MG PO TABS
2.0000 | ORAL_TABLET | Freq: Once | ORAL | Status: AC
  Administered 2014-06-27: 2 via ORAL
  Filled 2014-06-27: qty 2

## 2014-06-27 MED ORDER — HYDROCODONE-ACETAMINOPHEN 5-325 MG PO TABS: 2.0000 | ORAL_TABLET | ORAL | Status: AC | PRN

## 2014-06-27 MED ORDER — IBUPROFEN 800 MG PO TABS: 800.0000 mg | ORAL_TABLET | Freq: Three times a day (TID) | ORAL | Status: AC

## 2014-06-27 MED ORDER — IBUPROFEN 800 MG PO TABS
800.0000 mg | ORAL_TABLET | Freq: Once | ORAL | Status: AC
  Administered 2014-06-27: 800 mg via ORAL
  Filled 2014-06-27: qty 1

## 2014-06-27 NOTE — ED Notes (Signed)
Pt states that she was exercising and stepped on a dumb bell and rolled left ankle

## 2014-06-27 NOTE — Discharge Instructions (Signed)
Take vicodin and ibuprofen as needed for pain. You may take these medications together. Refer to attached documents for more information.  °

## 2014-06-27 NOTE — ED Notes (Signed)
Pt states she was working out and swung her foot down. When she did, the back of her ankle hit a dumbbell then she rolled over her foot. Since she states it hurts to walk, mild swelling to ankle, no obvious bruising. States pain is primarily in left ankle

## 2014-06-27 NOTE — ED Provider Notes (Signed)
CSN: [id number]     Arrival date & time 06/27/14  1913 History  This chart was scribed for Emilia Beck, PA-C, working with Pricilla Loveless, MD by Chestine Spore, ED Scribe. The patient was seen in room WTR6/WTR6 at 8:15 PM.    Chief Complaint  Patient presents with  . Ankle Injury      The history is provided by the patient. No language interpreter was used.    HPI Comments: Janet Williams is a 34 y.o. female with a medical hx of HTN who presents to the Emergency Department complaining of ankle injury onset tonight PTA. Pt reports that she was working out when her foot swung and hit a dumbbell which caused her to roll her ankle. She notes that she is having pain with bearing weight. She states that she is having associated symptoms of mild joint swelling. She denies bruising, hitting her head, LOC, and any other symptoms.   Past Medical History  Diagnosis Date  . Yeast infection   . Increased blood pressure (not hypertension)   . Hypertension    Past Surgical History  Procedure Laterality Date  . Cesarean section    . Tubal ligation    . Wisdom tooth extraction    . Lymphnod removal    . Hip surgery     Family History  Problem Relation Age of Onset  . Hypertension Maternal Grandmother   . Diabetes Maternal Grandmother   . Stroke Maternal Grandmother   . Hypertension Mother   . Hypertension Maternal Aunt    History  Substance Use Topics  . Smoking status: Never Smoker   . Smokeless tobacco: Never Used  . Alcohol Use: No   OB History    Gravida Para Term Preterm AB TAB SAB Ectopic Multiple Living   Review of Systems  Musculoskeletal: Positive for arthralgias (right ankle).  Skin: Negative for color change.  Neurological: Negative for syncope.  All other systems reviewed and are negative.     Allergies  Review of patient's allergies indicates no known allergies.  Home Medications   Prior to Admission medications   Not on File   BP  146/92 mmHg  Pulse 81  Temp(Src) 98 F (36.7 C) (Oral)  Resp 16  SpO2 99% Physical Exam  Constitutional: She is oriented to person, place, and time. She appears well-developed and well-nourished. No distress.  HENT:  Head: Normocephalic and atraumatic.  Eyes: EOM are normal.  Neck: Neck supple. No tracheal deviation present.  Cardiovascular: Normal rate.   Pulmonary/Chest: Effort normal. No respiratory distress.  Musculoskeletal:       Left ankle: She exhibits decreased range of motion (due to pain) and swelling (mild). She exhibits no deformity. Tenderness. Lateral malleolus tenderness found.  Limited ROM of left ankle due to pain. No obvious deformity. Lateral malleolus TTP with mild edema. Pt is able to wiggle toes.   Neurological: She is alert and oriented to person, place, and time.  Sensation intact of distal foot  Skin: Skin is warm and dry.  Psychiatric: She has a normal mood and affect. Her behavior is normal.  Nursing note and vitals reviewed.   ED Course  Procedures (including critical care time) DIAGNOSTIC STUDIES: Oxygen Saturation is 99% on RA, nl by my interpretation.    COORDINATION OF CARE: 8:18 PM-Discussed treatment plan which includes ankle brace, crutches, pain medication Rx, RICE, ibuprofen Rx, with pt at bedside and  pt agreed to plan.   Labs Review Labs Reviewed - No data to display  Imaging Review Dg Ankle Complete Left  06/27/2014   CLINICAL DATA:  Ankle injury, twisted foot  EXAM: LEFT ANKLE COMPLETE - 3+ VIEW  COMPARISON:  None.  FINDINGS: Three views of left ankle submitted. No acute fracture or subluxation. Small posterior spur of calcaneus. Ankle mortise is preserved. Mild soft tissue swelling medially.  IMPRESSION: No acute fracture or subluxation. Mild medial soft tissue swelling. Ankle mortise is preserved.   Electronically Signed   By: Natasha Mead M.D.   On: 06/27/2014 20:14   SPLINT APPLICATION Date/Time: 3:11 AM Authorized by: Emilia Beck Consent: Verbal consent obtained. Risks and benefits: risks, benefits and alternatives were discussed Consent given by: patient Splint applied by: orthopedic technician Location details: left ankle Splint type: ASO brace Supplies used: ASO brace Post-procedure: The splinted body part was neurovascularly unchanged following the procedure. Patient tolerance: Patient tolerated the procedure well with no immediate complications.      EKG Interpretation None      MDM   Final diagnoses:  Left ankle sprain, initial encounter    I reviewed the xray which shows no acute changes or fractures. No neurovascular compromise. Patient likely has an ankle sprain and will have ASO brace and crutches. Patient instructed to rest, ice, and elevate the affected ankle. Patient will have ibuprofen and vicodin for pain.   I personally performed the services described in this documentation, which was scribed in my presence. The recorded information has been reviewed and is accurate.     [id number] [place], PA-C 06/28/14 [id number]  Pricilla Loveless, MD 07/03/14 1414

## 2016-12-23 IMAGING — CR DG ANKLE COMPLETE 3+V*L*
3 series · 3 of 3 positions shown · non-contrast
Comparison: None.

CLINICAL DATA: Ankle injury, twisted foot

EXAM:
LEFT ANKLE COMPLETE - 3+ VIEW

[x ankle ap left]
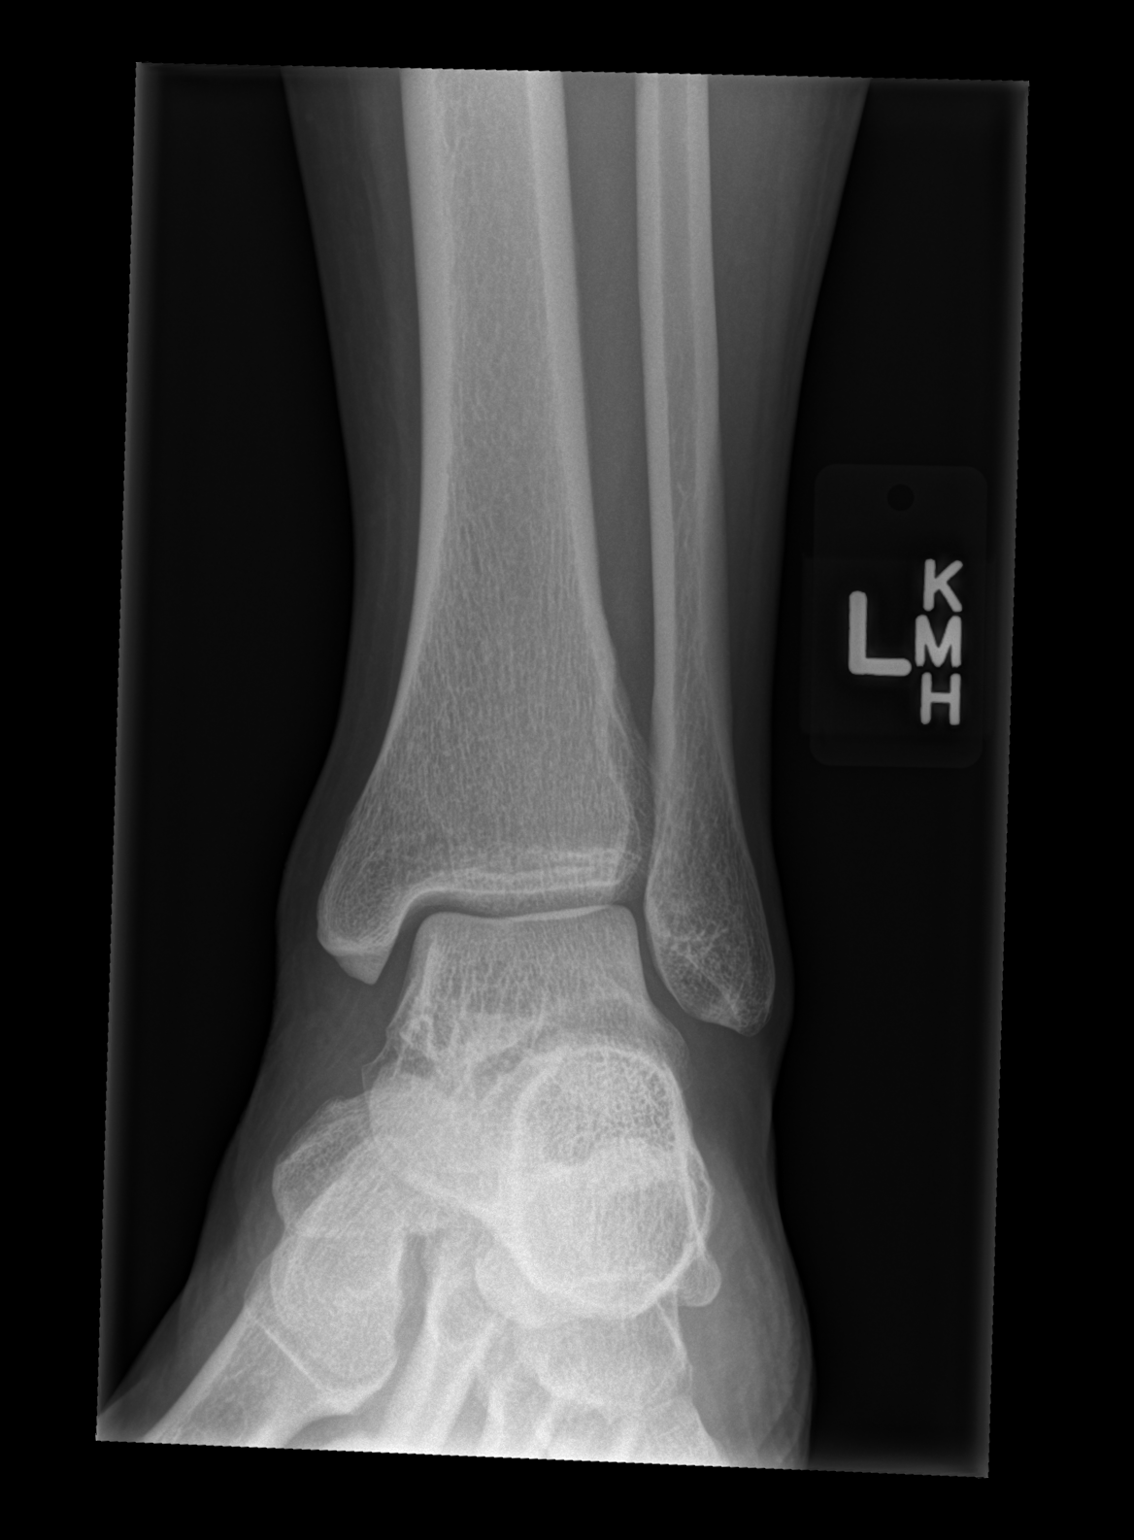

[x ankle obl left]
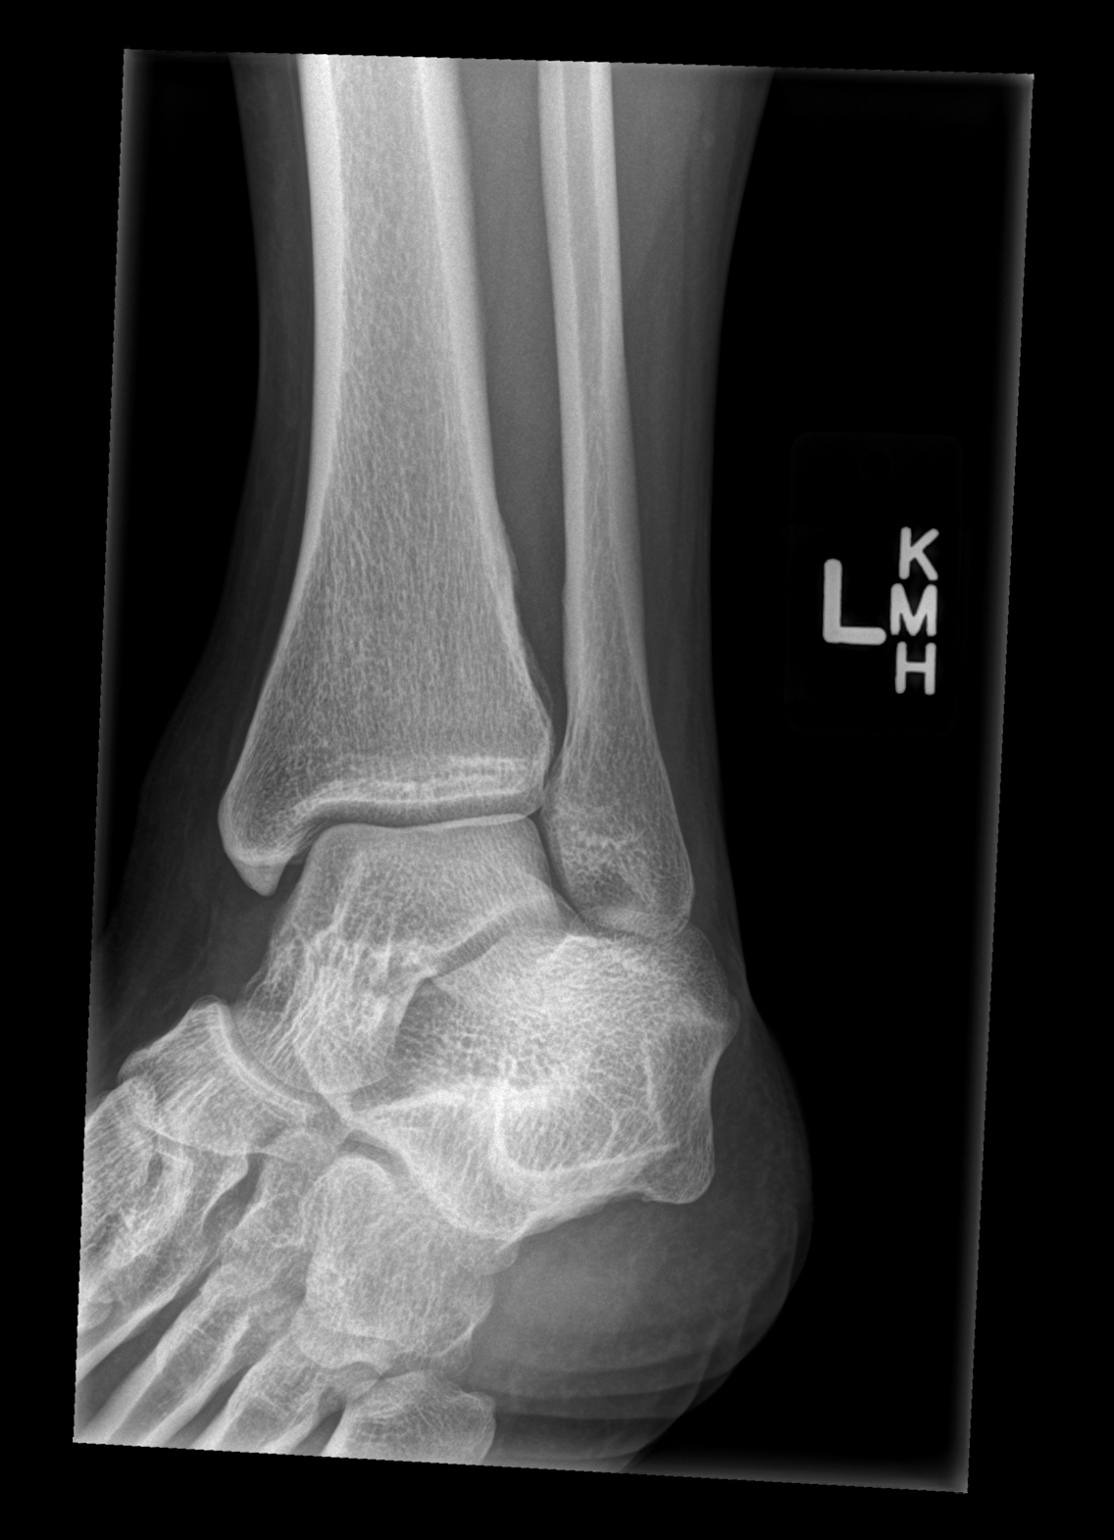

[x ankle lat left]
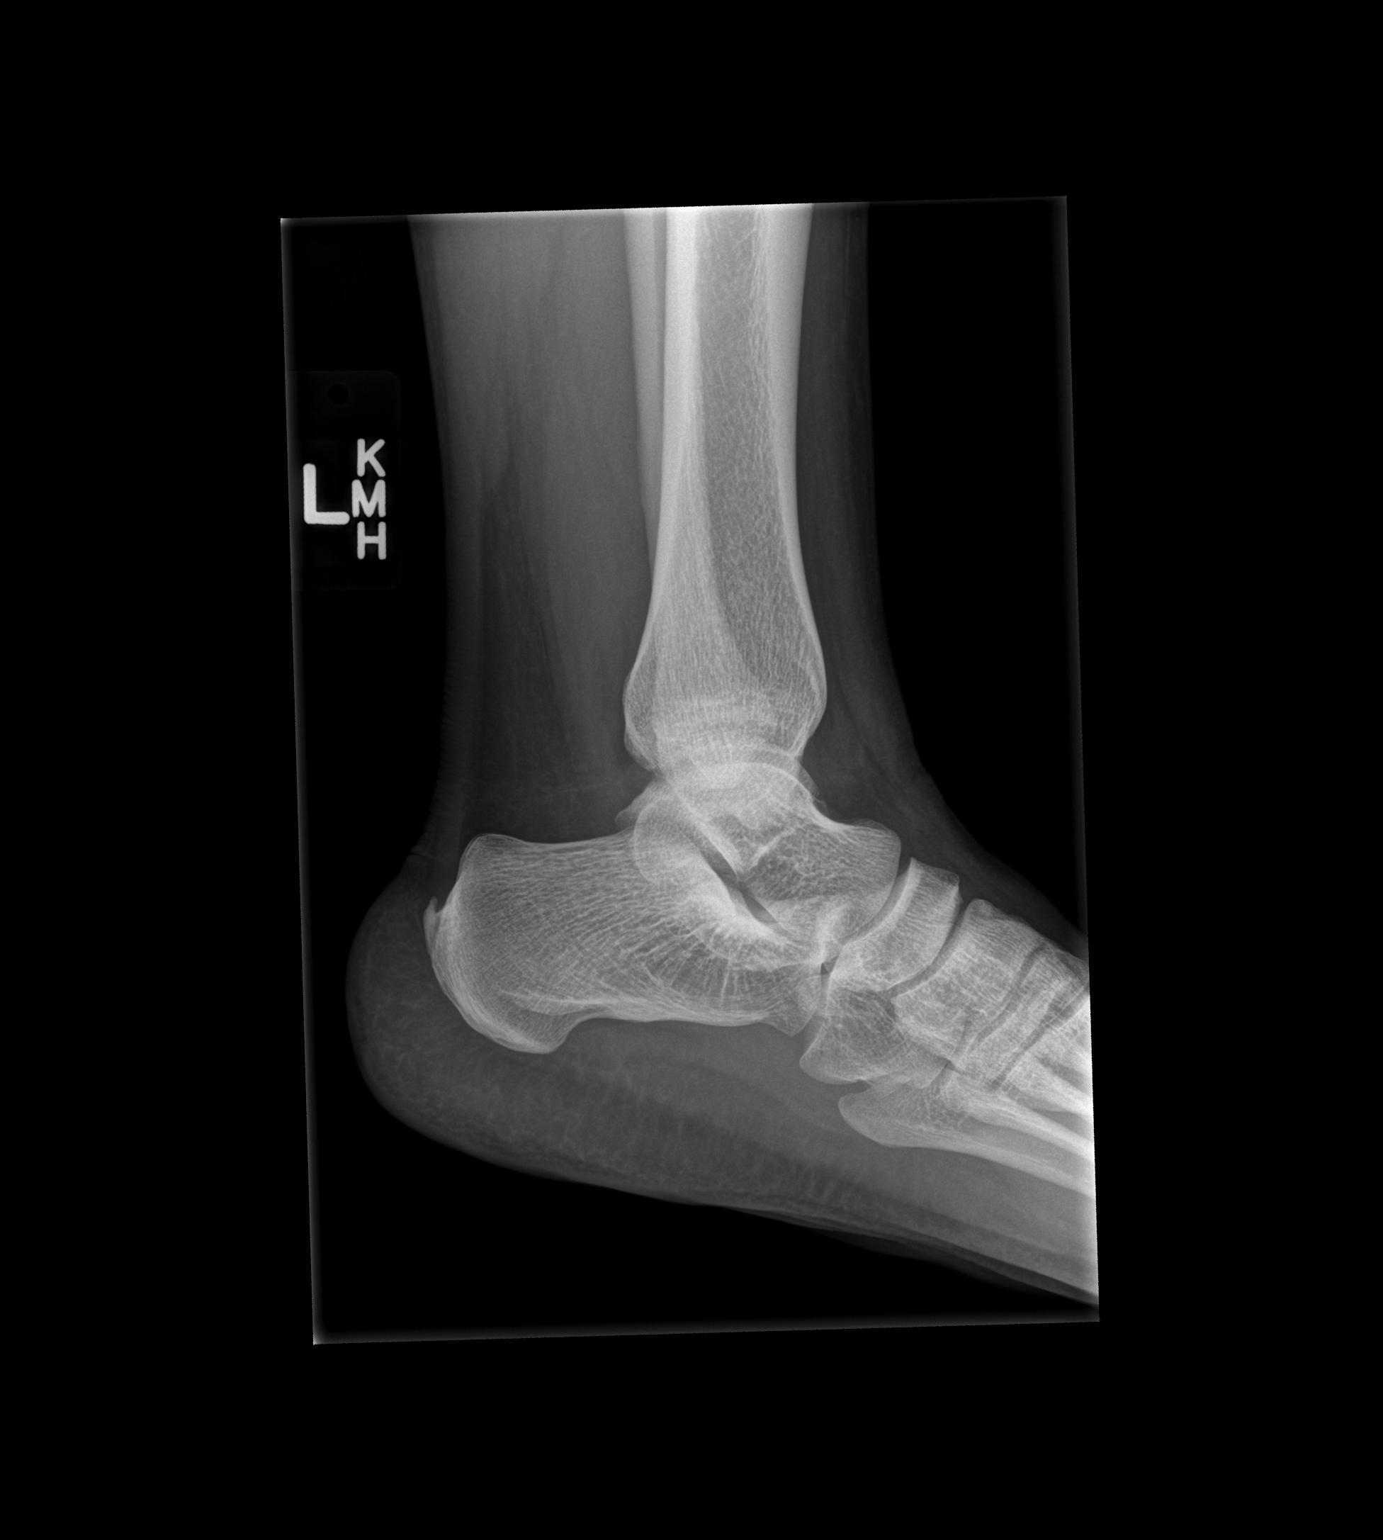

[3 of 3 positions shown; findings below may reference images not displayed]

FINDINGS: Three views of left ankle submitted. No acute fracture or
subluxation. Small posterior spur of calcaneus. Ankle mortise is
preserved. Mild soft tissue swelling medially.
IMPRESSION: No acute fracture or subluxation. Mild medial soft tissue swelling.
Ankle mortise is preserved.
